# Patient Record
Sex: Female | Born: 1995 | Race: Black or African American | Hispanic: No | Marital: Single | Smoking: Never smoker
Health system: Southern US, Community
[De-identification: ages and names within clinical notes are randomized; demographics above are authoritative.]

---
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
## 2018-07-29 ENCOUNTER — Other Ambulatory Visit: Payer: Self-pay

## 2018-07-29 DIAGNOSIS — Z20822 Contact with and (suspected) exposure to covid-19: Secondary | ICD-10-CM

## 2018-07-31 LAB — NOVEL CORONAVIRUS, NAA: SARS-CoV-2, NAA: NOT DETECTED

## 2019-02-13 ENCOUNTER — Ambulatory Visit: Payer: Medicaid Other | Attending: Internal Medicine

## 2019-02-13 DIAGNOSIS — Z20822 Contact with and (suspected) exposure to covid-19: Secondary | ICD-10-CM

## 2019-02-14 LAB — NOVEL CORONAVIRUS, NAA: SARS-CoV-2, NAA: NOT DETECTED

## 2019-02-15 ENCOUNTER — Telehealth: Payer: Self-pay | Admitting: General Practice

## 2019-02-15 NOTE — Telephone Encounter (Signed)
Negative COVID results given. Patient results "NOT Detected." Caller expressed understanding. ° °

## 2020-01-24 ENCOUNTER — Other Ambulatory Visit: Payer: Medicaid Other

## 2021-05-02 ENCOUNTER — Other Ambulatory Visit: Payer: Self-pay

## 2021-05-02 ENCOUNTER — Encounter (HOSPITAL_BASED_OUTPATIENT_CLINIC_OR_DEPARTMENT_OTHER): Payer: Self-pay

## 2021-05-02 DIAGNOSIS — R091 Pleurisy: Secondary | ICD-10-CM | POA: Diagnosis not present

## 2021-05-02 DIAGNOSIS — Z20822 Contact with and (suspected) exposure to covid-19: Secondary | ICD-10-CM | POA: Diagnosis not present

## 2021-05-02 DIAGNOSIS — R079 Chest pain, unspecified: Secondary | ICD-10-CM | POA: Diagnosis present

## 2021-05-02 DIAGNOSIS — R059 Cough, unspecified: Secondary | ICD-10-CM | POA: Insufficient documentation

## 2021-05-02 DIAGNOSIS — R030 Elevated blood-pressure reading, without diagnosis of hypertension: Secondary | ICD-10-CM | POA: Diagnosis not present

## 2021-05-02 DIAGNOSIS — R0602 Shortness of breath: Secondary | ICD-10-CM | POA: Diagnosis not present

## 2021-05-02 NOTE — ED Triage Notes (Signed)
Chest pain for the last 2 days. ?

## 2021-05-03 ENCOUNTER — Emergency Department (HOSPITAL_BASED_OUTPATIENT_CLINIC_OR_DEPARTMENT_OTHER)
Admission: EM | Admit: 2021-05-03 | Discharge: 2021-05-03 | Disposition: A | Discharge status: Home / Self Care | Payer: Managed Care, Other (non HMO) | Attending: Emergency Medicine | Admitting: Emergency Medicine

## 2021-05-03 ENCOUNTER — Emergency Department (HOSPITAL_BASED_OUTPATIENT_CLINIC_OR_DEPARTMENT_OTHER): Payer: Managed Care, Other (non HMO)

## 2021-05-03 DIAGNOSIS — R030 Elevated blood-pressure reading, without diagnosis of hypertension: Secondary | ICD-10-CM

## 2021-05-03 DIAGNOSIS — R091 Pleurisy: Secondary | ICD-10-CM

## 2021-05-03 LAB — BASIC METABOLIC PANEL
Anion gap: 12 (ref 5–15)
BUN: 22 mg/dL — ABNORMAL HIGH (ref 6–20)
CO2: 24 mmol/L (ref 22–32)
Calcium: 9.7 mg/dL (ref 8.9–10.3)
Chloride: 105 mmol/L (ref 98–111)
Creatinine, Ser: 0.91 mg/dL (ref 0.44–1.00)
GFR, Estimated: >60 mL/min (ref >60)
Glucose, Bld: 144 mg/dL — ABNORMAL HIGH (ref 70–99)
Potassium: 3.9 mmol/L (ref 3.5–5.1)
Sodium: 141 mmol/L (ref 135–145)

## 2021-05-03 LAB — RESP PANEL BY RT-PCR (FLU A&B, COVID) ARPGX2
Influenza A by PCR: NEGATIVE
Influenza B by PCR: NEGATIVE
SARS Coronavirus 2 by RT PCR: NEGATIVE

## 2021-05-03 LAB — CBC
HCT: 40.9 % (ref 36.0–46.0)
Hemoglobin: 12.3 g/dL (ref 12.0–15.0)
MCH: 22.2 pg — ABNORMAL LOW (ref 26.0–34.0)
MCHC: 30.1 g/dL (ref 30.0–36.0)
MCV: 73.8 fL — ABNORMAL LOW (ref 80.0–100.0)
Platelets: 286 10*3/uL (ref 150–400)
RBC: 5.54 MIL/uL — ABNORMAL HIGH (ref 3.87–5.11)
RDW: 14.2 % (ref 11.5–15.5)
WBC: 7.3 10*3/uL (ref 4.0–10.5)
nRBC: 0 % (ref 0.0–0.2)

## 2021-05-03 LAB — PREGNANCY, URINE: Preg Test, Ur: NEGATIVE

## 2021-05-03 LAB — D-DIMER, QUANTITATIVE: D-Dimer, Quant: 0.45 ug/mL-FEU (ref 0.00–0.50)

## 2021-05-03 LAB — TROPONIN I (HIGH SENSITIVITY)
Troponin I (High Sensitivity): 6 ng/L (ref <18)
Troponin I (High Sensitivity): 4 ng/L (ref <18)

## 2021-05-03 NOTE — ED Provider Notes (Signed)
?[facility] ?Provider Note ? ? ?CSN: [id number] ?Arrival date & time: 05/02/21  2345 ? ?  ? ?History ? ?Chief Complaint  ?Patient presents with  ? Chest Pain  ? ? ?Adriana Pena is a 26 y.o. female. ? ?The history is provided by the patient.  ?Chest Pain ?Pain location:  L chest ?Pain quality: sharp   ?Pain radiates to:  Upper back ?Pain severity:  Moderate ?Onset quality:  Gradual ?Duration:  3 days ?Timing:  Intermittent ?Progression:  Waxing and waning ?Chronicity:  New ?Worsened by:  Deep breathing ?Associated symptoms: cough and shortness of breath   ?Associated symptoms: no abdominal pain, no fever and no lower extremity edema   ?Associated symptoms comment:  Denies hemoptysis ?Risk factors: obesity and smoking   ?Risk factors: no birth control, no coronary artery disease, no diabetes mellitus and no prior DVT/PE   ?Patient reports recent upper respiratory infection was seen in urgent care.  She was not given antibiotics.  Over the past 2-3 days she has been having left-sided chest pain that is worse with breathing.  At times it radiates to her back, other times it goes into her left arm.  She reports shortness of breath. ?  ? ?Home Medications ?Prior to Admission medications   ?Not on File  ?   ? ?Allergies    ?Amoxil [amoxicillin] and Sulfa antibiotics   ? ?Review of Systems   ?Review of Systems  ?Constitutional:  Negative for fever.  ?Respiratory:  Positive for cough and shortness of breath.   ?Cardiovascular:  Positive for chest pain. Negative for leg swelling.  ?Gastrointestinal:  Negative for abdominal pain.  ? ?Physical Exam ?Updated Vital Signs ?BP (!) 159/93   Pulse 72   Temp 98.5 ?F (36.9 ?C)   Resp (!) 21   Ht 1.727 m (5\' 8" )   Wt 116.6 kg   LMP 05/02/2020 (Approximate)   SpO2 98%   BMI 39.08 kg/m?  ?Physical Exam ?CONSTITUTIONAL: Well developed/well nourished, no distress watching television ?HEAD: Normocephalic/atraumatic ?EYES: EOMI/PERRL ?ENMT: Mucous  membranes moist ?NECK: supple no meningeal signs ?SPINE/BACK:entire spine nontender ?CV: S1/S2 noted, no murmurs/rubs/gallops noted ?LUNGS: Lungs are clear to auscultation bilaterally, no apparent distress ?Chest-mild left-sided chest wall tenderness ?ABDOMEN: soft, nontender, no rebound or guarding, bowel sounds noted throughout abdomen ?GU:no cva tenderness ?NEURO: Pt is awake/alert/appropriate, moves all extremitiesx4.  No facial droop.  No arm drift.  Equal handgrips are noted. ?EXTREMITIES: pulses normal/equalx4, full ROM, no lower extremity edema or tenderness ?SKIN: warm, color normal ?PSYCH: Flat affect ? ?ED Results / Procedures / Treatments   ?Labs ?(all labs ordered are listed, but only abnormal results are displayed) ?Labs Reviewed  ?BASIC METABOLIC PANEL - Abnormal; Notable for the following components:  ?    Result Value  ? Glucose, Bld 144 (*)   ? BUN 22 (*)   ? All other components within normal limits  ?CBC - Abnormal; Notable for the following components:  ? RBC 5.54 (*)   ? MCV 73.8 (*)   ? MCH 22.2 (*)   ? All other components within normal limits  ?RESP PANEL BY RT-PCR (FLU A&B, COVID) ARPGX2  ?PREGNANCY, URINE  ?D-DIMER, QUANTITATIVE  ?TROPONIN I (HIGH SENSITIVITY)  ?TROPONIN I (HIGH SENSITIVITY)  ? ? ?EKG ?EKG Interpretation ? ?Date/Time:  Saturday May 03 2021 00:00:13 EDT ?Ventricular Rate:  100 ?PR Interval:  142 ?QRS Duration: 72 ?QT Interval:  340 ?QTC Calculation: 438 ?R Axis:   73 ?Text Interpretation:  Normal sinus rhythm Possible Anterior infarct , age undetermined Abnormal ECG No previous ECGs available Confirmed by Ripley Fraise [phone]) on 05/03/2021 12:17:09 AM ? ?Radiology ?DG Chest Port 1 View ? ?Result Date: 05/03/2021 ?CLINICAL DATA:  Chest pain EXAM: PORTABLE CHEST 1 VIEW COMPARISON:  01/10/2004 FINDINGS: The heart size and mediastinal contours are within normal limits. Both lungs are clear. The visualized skeletal structures are unremarkable. IMPRESSION: No active disease.  Electronically Signed   By: Ulyses Jarred M.D.   On: 05/03/2021 02:17   ? ?Procedures ?Procedures  ? ? ?Medications Ordered in ED ?Medications - No data to display ? ?ED Course/ Medical Decision Making/ A&P ?Clinical Course as of 05/03/21 0457  ?Sat May 03, 2021  ?[id number] Patient reports recent upper respiratory infection now with pleuritic type chest pain.  Extensive work-up has been unremarkable.  Patient resting comfortably in no acute distress.  No hypoxia.  Counseled patient on cutting back on vaping and smoking. [DW]  ?  ?Clinical Course User Index ?[DW] Ripley Fraise, MD  ? ?        HEART Score: 2 ?               ?Medical Decision Making ?Amount and/or Complexity of Data Reviewed ?Labs: ordered. ?Radiology: ordered. ? ? ?This patient presents to the ED for concern of chest pain, this involves an extensive number of treatment options, and is a complaint that carries with it a high risk of complications and morbidity.  The differential diagnosis includes but is not limited to acute coronary syndrome, aortic dissection, pulmonary embolism, pleurisy, pericarditis, pneumonia ? ?Comorbidities that complicate the patient evaluation: ?Patient?s presentation is complicated by their history of obesity ? ?Social Determinants of Health: ?Patient?s  tobacco abuse   increases the complexity of managing their presentation ? ?Additional history obtained: ?Records reviewed Care Everywhere/External Records ? ?Lab Tests: ?I Ordered, and personally interpreted labs.  The pertinent results include: Mild hyperglycemia ? ?Imaging Studies ordered: ?I ordered imaging studies including X-ray chest   ?I independently visualized and interpreted imaging which showed no acute finding ?I agree with the radiologist interpretation ? ?Cardiac Monitoring: ?The patient was maintained on a cardiac monitor.  I personally viewed and interpreted the cardiac monitor which showed an underlying rhythm of:  sinus rhythm ? ?Medicines ordered and  prescription drug management: ?Patient declines pain medicine ? ?Test Considered: ?I am unable to utilize the [facility] criteria due to an elevated heart rate.  However her Wells criteria for PE is low, will proceed with D-dimer testing to evaluate for PE ? ? ?Reevaluation: ?After the interventions noted above, I reevaluated the patient and found that they have :improved ? ?Complexity of problems addressed: ?Patient?s presentation is most consistent with  acute presentation with potential threat to life or bodily function ? ?Disposition: ?After consideration of the diagnostic results and the patient?s response to treatment,  ?I feel that the patent would benefit from discharge   .  ? ? ? ? ? ? ? ? ? ?Final Clinical Impression(s) / ED Diagnoses ?Final diagnoses:  ?Pleurisy  ?Elevated blood pressure reading  ? ? ?Rx / DC Orders ?ED Discharge Orders   ? ? None  ? ?  ? ? ?  ?Ripley Fraise, MD ?05/03/21 [phone] ? ?

## 2021-05-03 NOTE — ED Notes (Signed)
EDP at bedside  

## 2023-03-03 ENCOUNTER — Other Ambulatory Visit: Payer: Self-pay

## 2023-03-03 ENCOUNTER — Encounter (HOSPITAL_COMMUNITY): Payer: Self-pay | Admitting: *Deleted

## 2023-03-03 ENCOUNTER — Emergency Department (HOSPITAL_COMMUNITY)
Admission: EM | Admit: 2023-03-03 | Discharge: 2023-03-03 | Disposition: A | Discharge status: Home / Self Care | Payer: Worker's Compensation | Attending: Emergency Medicine | Admitting: Emergency Medicine

## 2023-03-03 ENCOUNTER — Emergency Department (HOSPITAL_COMMUNITY): Payer: Managed Care, Other (non HMO)

## 2023-03-03 DIAGNOSIS — S0990XA Unspecified injury of head, initial encounter: Secondary | ICD-10-CM | POA: Diagnosis present

## 2023-03-03 DIAGNOSIS — S0003XA Contusion of scalp, initial encounter: Secondary | ICD-10-CM | POA: Diagnosis not present

## 2023-03-03 DIAGNOSIS — Y990 Civilian activity done for income or pay: Secondary | ICD-10-CM | POA: Diagnosis not present

## 2023-03-03 DIAGNOSIS — W2209XA Striking against other stationary object, initial encounter: Secondary | ICD-10-CM | POA: Insufficient documentation

## 2023-03-03 NOTE — ED Provider Notes (Signed)
 [facility] AT [facility] Provider Note   CSN: [id number] Arrival date & time: 03/03/23  1837     History  Chief Complaint  Patient presents with   Headache    Adriana Pena is a 28 y.o. female.  Patient complains of hitting her head 2 days ago at work.  Patient reports that she hit the middle of her head on a shelf at work.  Patient reports that she has felt dizzy and nauseated.  Patient reports that she has had some numbness in her left arm and feeling like she is sluggish.  Patient reports that she is walking slower.  Patient reports that she did not lose consciousness.  Patient reports she has had a headache since the time of the injury.  The history is provided by the patient. No language interpreter was used.  Headache      Home Medications Prior to Admission medications   Not on File      Allergies    Amoxil [amoxicillin] and Sulfa antibiotics    Review of Systems   Review of Systems  Neurological:  Positive for headaches.  All other systems reviewed and are negative.   Physical Exam Updated Vital Signs BP (!) 137/90 (BP Location: Left Arm)   Pulse 92   Temp 99.2 F (37.3 C) (Oral)   Resp 19   Ht 5\' 8"  (1.727 m)   Wt 116.6 kg   LMP 01/27/2023   SpO2 100%   BMI 39.09 kg/m  Physical Exam Vitals and nursing note reviewed.  Constitutional:      Appearance: She is well-developed.  HENT:     Head: Normocephalic.     Mouth/Throat:     Mouth: Mucous membranes are moist.  Eyes:     Extraocular Movements: Extraocular movements intact.     Pupils: Pupils are equal, round, and reactive to light.  Cardiovascular:     Rate and Rhythm: Normal rate.  Pulmonary:     Effort: Pulmonary effort is normal.  Abdominal:     General: There is no distension.  Musculoskeletal:        General: Normal range of motion.     Cervical back: Normal range of motion.  Skin:    General: Skin is warm.  Neurological:     Mental Status:  She is alert and oriented to person, place, and time.     Comments: Tender mid scalp, no bruising no swelling no laceration.  Psychiatric:        Mood and Affect: Mood normal.        Behavior: Behavior normal.   Cervical spine nontender diffusely tender upper thoracic spine.  ED Results / Procedures / Treatments   Labs (all labs ordered are listed, but only abnormal results are displayed) Labs Reviewed - No data to display  EKG None  Radiology No results found.  Procedures Procedures    Medications Ordered in ED Medications - No data to display  ED Course/ Medical Decision Making/ A&P                                 Medical Decision Making Patient complains of pain in her head.  Patient hit her head 2 days ago.  Amount and/or Complexity of Data Reviewed Radiology: ordered and independent interpretation performed. Decision-making details documented in ED Course.    Details: CT head ordered.  CT scan shows no acute disease.  Final Clinical Impression(s) / ED Diagnoses Final diagnoses:  Contusion of scalp, initial encounter    Rx / DC Orders ED Discharge Orders     None      An After Visit Summary was printed and given to the patient.    Elson Areas, PA-C 03/03/23 2324    Glyn Ade, MD 03/04/23 1500

## 2023-03-03 NOTE — Discharge Instructions (Signed)
Tylenol for discomfort.  Follow up with your Physician for recheck.

## 2023-03-03 NOTE — ED Notes (Signed)
 Patient verbalizes understanding of discharge instructions. Opportunity for questioning and answers were provided. Armband removed by staff, pt discharged from ED. Pt ambulatory to ED waiting room with steady gait.

## 2023-03-03 NOTE — ED Triage Notes (Signed)
 The pt is c/o striking her head 2 days ago on a metal shelf no loc  headache nausea fatigue weakness since then  lmp jan 15th

## 2023-07-03 IMAGING — DX DG CHEST 1V PORT
1 series · 1 of 1 positions shown · non-contrast
Comparison: 01/10/2004

CLINICAL DATA: Chest pain

EXAM:
PORTABLE CHEST 1 VIEW

[chest ap]
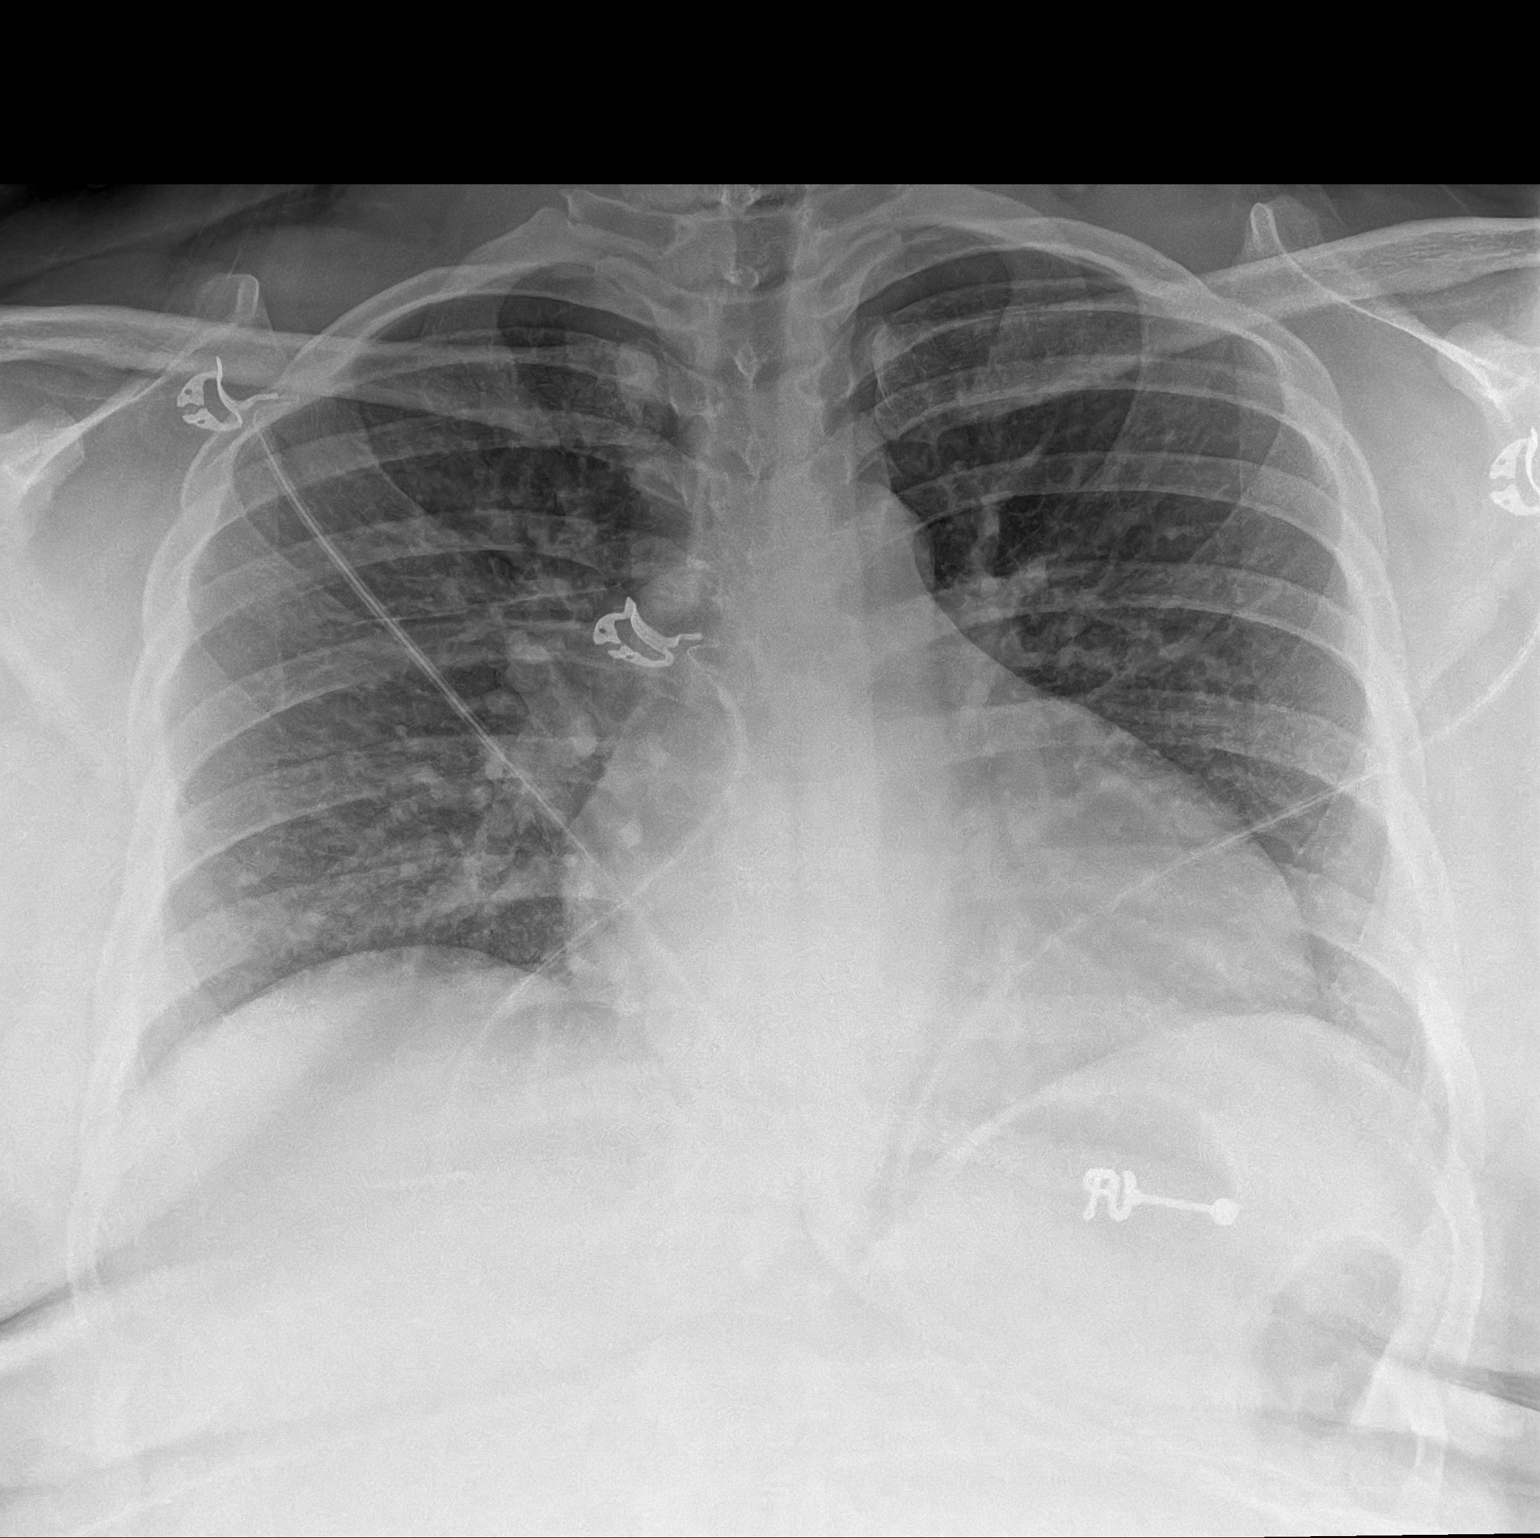

[1 of 1 positions shown; findings below may reference images not displayed]

FINDINGS: The heart size and mediastinal contours are within normal limits.
Both lungs are clear. The visualized skeletal structures are
unremarkable.
IMPRESSION: No active disease.
